# Patient Record
Sex: Male | Born: 1985 | Race: White | Hispanic: No | Marital: Single | Smoking: Never smoker
Health system: Southern US, Community
[De-identification: ages and names within clinical notes are randomized; demographics above are authoritative.]

## PROBLEM LIST (undated history)

## (undated) DIAGNOSIS — F909 Attention-deficit hyperactivity disorder, unspecified type: Secondary | ICD-10-CM

## (undated) DIAGNOSIS — K5939 Other megacolon: Secondary | ICD-10-CM

## (undated) DIAGNOSIS — J45909 Unspecified asthma, uncomplicated: Secondary | ICD-10-CM

## (undated) DIAGNOSIS — F32A Depression, unspecified: Secondary | ICD-10-CM

## (undated) HISTORY — PX: TONSILLECTOMY: SUR1361

---

## 2017-10-25 ENCOUNTER — Emergency Department (HOSPITAL_COMMUNITY)
Admission: EM | Admit: 2017-10-25 | Discharge: 2017-10-25 | Disposition: A | Discharge status: Home / Self Care | Payer: Medicaid - Out of State | Attending: Emergency Medicine | Admitting: Emergency Medicine

## 2017-10-25 ENCOUNTER — Encounter (HOSPITAL_COMMUNITY): Payer: Self-pay

## 2017-10-25 DIAGNOSIS — R112 Nausea with vomiting, unspecified: Secondary | ICD-10-CM | POA: Diagnosis present

## 2017-10-25 DIAGNOSIS — E876 Hypokalemia: Secondary | ICD-10-CM | POA: Diagnosis not present

## 2017-10-25 DIAGNOSIS — R197 Diarrhea, unspecified: Secondary | ICD-10-CM | POA: Insufficient documentation

## 2017-10-25 LAB — CBC
HEMATOCRIT: 44.4 % (ref 39.0–52.0)
Hemoglobin: 14.7 g/dL (ref 13.0–17.0)
MCH: 29.8 pg (ref 26.0–34.0)
MCHC: 33.1 g/dL (ref 30.0–36.0)
MCV: 90.1 fL (ref 78.0–100.0)
Platelets: 280 10*3/uL (ref 150–400)
RBC: 4.93 MIL/uL (ref 4.22–5.81)
RDW: 12.9 % (ref 11.5–15.5)
WBC: 5.7 10*3/uL (ref 4.0–10.5)

## 2017-10-25 LAB — COMPREHENSIVE METABOLIC PANEL
ALT: 34 U/L (ref 17–63)
AST: 23 U/L (ref 15–41)
Albumin: 4.1 g/dL (ref 3.5–5.0)
Alkaline Phosphatase: 49 U/L (ref 38–126)
Anion gap: 9 (ref 5–15)
BUN: 12 mg/dL (ref 6–20)
CHLORIDE: 103 mmol/L (ref 101–111)
CO2: 21 mmol/L — AB (ref 22–32)
CREATININE: 1.02 mg/dL (ref 0.61–1.24)
Calcium: 8.5 mg/dL — ABNORMAL LOW (ref 8.9–10.3)
GFR calc Af Amer: >60 mL/min (ref >60)
GFR calc non Af Amer: >60 mL/min (ref >60)
Glucose, Bld: 113 mg/dL — ABNORMAL HIGH (ref 65–99)
POTASSIUM: 3.4 mmol/L — AB (ref 3.5–5.1)
SODIUM: 133 mmol/L — AB (ref 135–145)
Total Bilirubin: 0.8 mg/dL (ref 0.3–1.2)
Total Protein: 6.9 g/dL (ref 6.5–8.1)

## 2017-10-25 LAB — LIPASE, BLOOD: LIPASE: 24 U/L (ref 11–51)

## 2017-10-25 MED ORDER — ONDANSETRON 4 MG PO TBDP
4.0000 mg | ORAL_TABLET | Freq: Once | ORAL | Status: AC
Start: 2017-10-25 — End: 2017-10-25
  Administered 2017-10-25: 4 mg via ORAL
  Filled 2017-10-25: qty 1

## 2017-10-25 MED ORDER — ONDANSETRON 4 MG PO TBDP: ORAL_TABLET | ORAL | 0 refills | Status: DC

## 2017-10-25 NOTE — ED Triage Notes (Signed)
Patient complains of abdominal cramping that started yesterday with vomiting and diarrhea today. Patient denies abdominal pain at triage and no active vomiting

## 2017-10-25 NOTE — ED Notes (Signed)
Pt stable and ambulatory for discharge, states understanding follow up.  

## 2017-10-25 NOTE — ED Provider Notes (Signed)
[facility]   CSN: [id number] Arrival date & time: 10/25/17  1238     History   Chief Complaint No chief complaint on file.   HPI David Valencia is a 32 y.o. male.  Patient with history of megacolon,no regular follow-up by gastroenterology, depression history presents with recurrent vomiting and diarrhea since yesterday. No sick contacts, no recent travel, no abdominal pain. Mild cramping mostly diarrhea currently.no new medications or new foods.     History reviewed. No pertinent past medical history.  There are no active problems to display for this patient.   History reviewed. No pertinent surgical history.      Home Medications    Prior to Admission medications   Medication Sig Start Date End Date Taking? Authorizing Provider  ondansetron (ZOFRAN ODT) 4 MG disintegrating tablet 4mg  ODT q4 hours prn nausea/vomit 10/25/17   Blane OharaZavitz, Shivansh Hardaway, MD    Family History No family history on file.  Social History Social History   Tobacco Use  . Smoking status: Not on file  Substance Use Topics  . Alcohol use: Not on file  . Drug use: Not on file     Allergies   Sulfa antibiotics   Review of Systems Review of Systems  Constitutional: Negative for chills and fever.  HENT: Negative for congestion.   Eyes: Negative for visual disturbance.  Respiratory: Negative for shortness of breath.   Cardiovascular: Negative for chest pain.  Gastrointestinal: Positive for diarrhea, nausea and vomiting. Negative for abdominal pain.  Genitourinary: Negative for dysuria and flank pain.  Musculoskeletal: Negative for back pain, neck pain and neck stiffness.  Skin: Negative for rash.  Neurological: Negative for light-headedness and headaches.     Physical Exam Updated Vital Signs BP 115/90 (BP Location: Right Arm)   Pulse 96   Temp 98.1 F (36.7 C) (Oral)   Resp 20   SpO2 96%   Physical Exam  Constitutional: He is  oriented to person, place, and time. He appears well-developed and well-nourished.  HENT:  Head: Normocephalic and atraumatic.  Eyes: Conjunctivae are normal. Right eye exhibits no discharge. Left eye exhibits no discharge.  Neck: Normal range of motion. Neck supple. No tracheal deviation present.  Cardiovascular: Normal rate and regular rhythm.  Pulmonary/Chest: Effort normal and breath sounds normal.  Abdominal: Soft. He exhibits no distension. There is no tenderness. There is no guarding.  Musculoskeletal: He exhibits no edema.  Neurological: He is alert and oriented to person, place, and time.  Skin: Skin is warm. No rash noted.  Psychiatric: He has a normal mood and affect.  Nursing note and vitals reviewed.    ED Treatments / Results  Labs (all labs ordered are listed, but only abnormal results are displayed) Labs Reviewed  COMPREHENSIVE METABOLIC PANEL - Abnormal; Notable for the following components:      Result Value   Sodium 133 (*)    Potassium 3.4 (*)    CO2 21 (*)    Glucose, Bld 113 (*)    Calcium 8.5 (*)    All other components within normal limits  LIPASE, BLOOD  CBC  URINALYSIS, ROUTINE W REFLEX MICROSCOPIC    EKG None  Radiology No results found.  Procedures Procedures (including critical care time)  Medications Ordered in ED Medications  ondansetron (ZOFRAN-ODT) disintegrating tablet 4 mg (has no administration in time range)     Initial Impression / Assessment and Plan / ED Course  I have reviewed the triage vital  signs and the nursing notes.  Pertinent labs & imaging results that were available during my care of the patient were reviewed by me and considered in my medical decision making (see chart for details).    Ell-appearing patient presents with recurrent vomiting and diarrhea. Blood work reassuring, no abdominal tenderness on exam no fevers. Discussed close follow-up with primary doctor in 2 days if no improvement. Nausea medicines  given. Oral fluids.  Results and differential diagnosis were discussed with the patient/parent/guardian. Xrays were independently reviewed by myself.  Close follow up outpatient was discussed, comfortable with the plan.   Medications  ondansetron (ZOFRAN-ODT) disintegrating tablet 4 mg (has no administration in time range)    Vitals:   10/25/17 1318  BP: 115/90  Pulse: 96  Resp: 20  Temp: 98.1 F (36.7 C)  TempSrc: Oral  SpO2: 96%    Final diagnoses:  Nausea vomiting and diarrhea  Hypokalemia     Final Clinical Impressions(s) / ED Diagnoses   Final diagnoses:  Nausea vomiting and diarrhea  Hypokalemia    ED Discharge Orders        Ordered    ondansetron (ZOFRAN ODT) 4 MG disintegrating tablet     10/25/17 1632       Blane Ohara, MD 10/25/17 [phone]

## 2017-10-25 NOTE — Discharge Instructions (Signed)
Gradually increase oral fluid and food intake. Take Zofran as needed for nausea. Your potassium is only minimally elevated and you do not need pillsfor this at this time. Follow-up with local doctor this week.

## 2018-10-21 ENCOUNTER — Encounter (HOSPITAL_COMMUNITY): Payer: Self-pay

## 2018-10-21 ENCOUNTER — Emergency Department (HOSPITAL_COMMUNITY): Payer: Self-pay

## 2018-10-21 ENCOUNTER — Other Ambulatory Visit: Payer: Self-pay

## 2018-10-21 ENCOUNTER — Emergency Department (HOSPITAL_COMMUNITY)
Admission: EM | Admit: 2018-10-21 | Discharge: 2018-10-21 | Disposition: A | Discharge status: Home / Self Care | Payer: Self-pay | Attending: Emergency Medicine | Admitting: Emergency Medicine

## 2018-10-21 DIAGNOSIS — R05 Cough: Secondary | ICD-10-CM | POA: Insufficient documentation

## 2018-10-21 DIAGNOSIS — R059 Cough, unspecified: Secondary | ICD-10-CM

## 2018-10-21 MED ORDER — BENZONATATE 200 MG PO CAPS: 200.0000 mg | ORAL_CAPSULE | Freq: Three times a day (TID) | ORAL | 0 refills | Status: DC | PRN

## 2018-10-21 MED ORDER — PREDNISONE 20 MG PO TABS: 40.0000 mg | ORAL_TABLET | Freq: Every day | ORAL | 0 refills | Status: DC

## 2018-10-21 MED ORDER — ALBUTEROL SULFATE HFA 108 (90 BASE) MCG/ACT IN AERS
2.0000 | INHALATION_SPRAY | Freq: Once | RESPIRATORY_TRACT | Status: AC
  Administered 2018-10-21: 2 via RESPIRATORY_TRACT
  Filled 2018-10-21: qty 6.7

## 2018-10-21 MED ORDER — PREDNISONE 20 MG PO TABS
40.0000 mg | ORAL_TABLET | Freq: Once | ORAL | Status: AC
  Administered 2018-10-21: 40 mg via ORAL
  Filled 2018-10-21: qty 2

## 2018-10-21 NOTE — Discharge Instructions (Signed)
Start the prednisone tomorrow.  2 puffs of your albuterol inhaler 4 times a day as needed.     Person Under Monitoring Name: David Valencia  Location: [address] [place] [address]   Infection Prevention Recommendations for Individuals Confirmed to have, or Being Evaluated for, 2019 Novel Coronavirus (COVID-19) Infection Who Receive Care at Home  Individuals who are confirmed to have, or are being evaluated for, COVID-19 should follow the prevention steps below until a healthcare provider or local or state health department says they can return to normal activities.  Stay home except to get medical care You should restrict activities outside your home, except for getting medical care. Do not go to work, school, or public areas, and do not use public transportation or taxis.  Call ahead before visiting your doctor Before your medical appointment, call the healthcare provider and tell them that you have, or are being evaluated for, COVID-19 infection. This will help the healthcare providers office take steps to keep other people from getting infected. Ask your healthcare provider to call the local or state health department.  Monitor your symptoms Seek prompt medical attention if your illness is worsening (e.g., difficulty breathing). Before going to your medical appointment, call the healthcare provider and tell them that you have, or are being evaluated for, COVID-19 infection. Ask your healthcare provider to call the local or state health department.  Wear a facemask You should wear a facemask that covers your nose and mouth when you are in the same room with other people and when you visit a healthcare provider. People who live with or visit you should also wear a facemask while they are in the same room with you.  Separate yourself from other people in your home As much as possible, you should stay in a different room from other people in your home. Also, you should  use a separate bathroom, if available.  Avoid sharing household items You should not share dishes, drinking glasses, cups, eating utensils, towels, bedding, or other items with other people in your home. After using these items, you should wash them thoroughly with soap and water.  Cover your coughs and sneezes Cover your mouth and nose with a tissue when you cough or sneeze, or you can cough or sneeze into your sleeve. Throw used tissues in a lined trash can, and immediately wash your hands with soap and water for at least 20 seconds or use an alcohol-based hand rub.  Wash your [employer] your hands often and thoroughly with soap and water for at least 20 seconds. You can use an alcohol-based hand sanitizer if soap and water are not available and if your hands are not visibly dirty. Avoid touching your eyes, nose, and mouth with unwashed hands.   Prevention Steps for Caregivers and Household Members of Individuals Confirmed to have, or Being Evaluated for, COVID-19 Infection Being Cared for in the Home  If you live with, or provide care at home for, a person confirmed to have, or being evaluated for, COVID-19 infection please follow these guidelines to prevent infection:  Follow healthcare providers instructions Make sure that you understand and can help the patient follow any healthcare provider instructions for all care.  Provide for the patients basic needs You should help the patient with basic needs in the home and provide support for getting groceries, prescriptions, and other personal needs.  Monitor the patients symptoms If they are getting sicker, call his or her medical provider and tell them  that the patient has, or is being evaluated for, COVID-19 infection. This will help the healthcare providers office take steps to keep other people from getting infected. Ask the healthcare provider to call the local or state health department.  Limit the number of people who  have contact with the patient If possible, have only one caregiver for the patient. Other household members should stay in another home or place of residence. If this is not possible, they should stay in another room, or be separated from the patient as much as possible. Use a separate bathroom, if available. Restrict visitors who do not have an essential need to be in the home.  Keep older adults, very young children, and other sick people away from the patient Keep older adults, very young children, and those who have compromised immune systems or chronic health conditions away from the patient. This includes people with chronic heart, lung, or kidney conditions, diabetes, and cancer.  Ensure good ventilation Make sure that shared spaces in the home have good air flow, such as from an air conditioner or an opened window, weather permitting.  Wash your hands often Wash your hands often and thoroughly with soap and water for at least 20 seconds. You can use an alcohol based hand sanitizer if soap and water are not available and if your hands are not visibly dirty. Avoid touching your eyes, nose, and mouth with unwashed hands. Use disposable paper towels to dry your hands. If not available, use dedicated cloth towels and replace them when they become wet.  Wear a facemask and gloves Wear a disposable facemask at all times in the room and gloves when you touch or have contact with the patients blood, body fluids, and/or secretions or excretions, such as sweat, saliva, sputum, nasal mucus, vomit, urine, or feces.  Ensure the mask fits over your nose and mouth tightly, and do not touch it during use. Throw out disposable facemasks and gloves after using them. Do not reuse. Wash your hands immediately after removing your facemask and gloves. If your personal clothing becomes contaminated, carefully remove clothing and launder. Wash your hands after handling contaminated clothing. Place all used  disposable facemasks, gloves, and other waste in a lined container before disposing them with other household waste. Remove gloves and wash your hands immediately after handling these items.  Do not share dishes, glasses, or other household items with the patient Avoid sharing household items. You should not share dishes, drinking glasses, cups, eating utensils, towels, bedding, or other items with a patient who is confirmed to have, or being evaluated for, COVID-19 infection. After the person uses these items, you should wash them thoroughly with soap and water.  Wash laundry thoroughly Immediately remove and wash clothes or bedding that have blood, body fluids, and/or secretions or excretions, such as sweat, saliva, sputum, nasal mucus, vomit, urine, or feces, on them. Wear gloves when handling laundry from the patient. Read and follow directions on labels of laundry or clothing items and detergent. In general, wash and dry with the warmest temperatures recommended on the label.  Clean all areas the individual has used often Clean all touchable surfaces, such as counters, tabletops, doorknobs, bathroom fixtures, toilets, phones, keyboards, tablets, and bedside tables, every day. Also, clean any surfaces that may have blood, body fluids, and/or secretions or excretions on them. Wear gloves when cleaning surfaces the patient has come in contact with. Use a diluted bleach solution (e.g., dilute bleach with 1 part bleach and 10  parts water) or a household disinfectant with a label that says EPA-registered for coronaviruses. To make a bleach solution at home, add 1 tablespoon of bleach to 1 quart (4 cups) of water. For a larger supply, add  cup of bleach to 1 gallon (16 cups) of water. Read labels of cleaning products and follow recommendations provided on product labels. Labels contain instructions for safe and effective use of the cleaning product including precautions you should take when applying  the product, such as wearing gloves or eye protection and making sure you have good ventilation during use of the product. Remove gloves and wash hands immediately after cleaning.  Monitor yourself for signs and symptoms of illness Caregivers and household members are considered close contacts, should monitor their health, and will be asked to limit movement outside of the home to the extent possible. Follow the monitoring steps for close contacts listed on the symptom monitoring form.   ? If you have additional questions, contact your local health department or call the epidemiologist on call at [phone] (available 24/7). ? This guidance is subject to change. For the most up-to-date guidance from [facility], please refer to their website: [employer]

## 2018-10-21 NOTE — ED Provider Notes (Signed)
[facility] EMERGENCY DEPARTMENT Provider Note   CSN: [id number] Arrival date & time: 10/21/18  1820    History   Chief Complaint Chief Complaint  Patient presents with  . Cough    HPI [place] Gwinner is a 33 y.o. male.     HPI   [place] Antrobus is a 33 y.o. male who presents to the Emergency Department complaining of cough and chest congestion.  Reports symptoms have been present for one month.  Cough worse for several days and occasionally productive.  He states that he has frequent episodes of bronchitis and usually improves with prednisone and albuterol.  He denies fever, body aches, chest pain, shortness of breath.  He is employed at [employer] and concerned if he has COVID19.   History reviewed. No pertinent past medical history.  There are no active problems to display for this patient.   History reviewed. No pertinent surgical history.    Home Medications    Prior to Admission medications   Medication Sig Start Date End Date Taking? Authorizing Provider  albuterol (PROVENTIL HFA;VENTOLIN HFA) 108 (90 Base) MCG/ACT inhaler Inhale 1-2 puffs into the lungs every 6 (six) hours as needed for wheezing or shortness of breath.   Yes [provider]    Family History No family history on file.  Social History Social History   Tobacco Use  . Smoking status: Never Smoker  . Smokeless tobacco: Never Used  Substance Use Topics  . Alcohol use: Never    Frequency: Never  . Drug use: Never     Allergies   Sulfa antibiotics   Review of Systems Review of Systems  Constitutional: Negative for appetite change, chills and fever.  HENT: Negative for congestion, sore throat and trouble swallowing.   Respiratory: Positive for cough. Negative for chest tightness, shortness of breath and wheezing.   Cardiovascular: Negative for chest pain.  Gastrointestinal: Negative for abdominal pain, nausea and vomiting.  Genitourinary: Negative for dysuria.   Musculoskeletal: Negative for arthralgias and myalgias.  Skin: Negative for rash.  Neurological: Negative for dizziness, weakness and numbness.  Hematological: Negative for adenopathy.     Physical Exam Updated Vital Signs BP 134/81 (BP Location: Right Arm)   Pulse 87   Temp 98 F (36.7 C) (Oral)   Resp 18   Ht 5\' 11"  (1.803 m)   Wt 106.6 kg   SpO2 97%   BMI 32.78 kg/m   Physical Exam Vitals signs and nursing note reviewed.  Constitutional:      General: He is not in acute distress.    Appearance: Normal appearance. He is well-developed.  HENT:     Head: Normocephalic and atraumatic.     Right Ear: Tympanic membrane and ear canal normal.     Left Ear: Tympanic membrane and ear canal normal.     Mouth/Throat:     Mouth: Mucous membranes are moist.     Pharynx: Oropharynx is clear. Uvula midline. No oropharyngeal exudate or posterior oropharyngeal erythema.  Neck:     Musculoskeletal: Full passive range of motion without pain, normal range of motion and neck supple.     Trachea: Phonation normal.  Cardiovascular:     Rate and Rhythm: Normal rate and regular rhythm.     Pulses: Normal pulses.     Heart sounds: No murmur.  Pulmonary:     Effort: Pulmonary effort is normal. No respiratory distress.     Breath sounds: Normal breath sounds. No stridor. No wheezing or rales.  Chest:     Chest wall: No tenderness.  Abdominal:     Palpations: Abdomen is soft.     Tenderness: There is no abdominal tenderness.  Musculoskeletal: Normal range of motion.  Lymphadenopathy:     Cervical: No cervical adenopathy.  Skin:    General: Skin is warm.     Capillary Refill: Capillary refill takes less than 2 seconds.     Findings: No rash.  Neurological:     Mental Status: He is alert. Mental status is at baseline.     Motor: No abnormal muscle tone.      ED Treatments / Results  Labs (all labs ordered are listed, but only abnormal results are displayed) Labs Reviewed - No data  to display  EKG None  Radiology Dg Chest Portable 1 View  Result Date: 10/21/2018 CLINICAL DATA:  Cough and congestion. EXAM: PORTABLE CHEST 1 VIEW COMPARISON:  None. FINDINGS: The cardiomediastinal contours are normal. Mild bronchial thickening. Pulmonary vasculature is normal. No consolidation, pleural effusion, or pneumothorax. No acute osseous abnormalities are seen. IMPRESSION: Mild bronchial thickening without pneumonia. Electronically Signed   By: Narda Rutherford M.D.   On: 10/21/2018 21:44    Procedures Procedures (including critical care time)  Medications Ordered in ED Medications  albuterol (PROVENTIL HFA;VENTOLIN HFA) 108 (90 Base) MCG/ACT inhaler 2 puff (2 puffs Inhalation Given 10/21/18 2234)  predniSONE (DELTASONE) tablet 40 mg (40 mg Oral Given 10/21/18 2233)     Initial Impression / Assessment and Plan / ED Course  I have reviewed the triage vital signs and the nursing notes.  Pertinent labs & imaging results that were available during my care of the patient were reviewed by me and considered in my medical decision making (see chart for details).        Patient with cough and chest congestion for 1 month, worse for 2 to 3 days.  He is afebrile here, no shortness of breath. No respiratory distress or hypoxia.  He is well appearing.   He is employed as a TEFL teacher.  Patient understands he will not be tested for COVID-19. He agrees to self quarantine instructions, information provided. Strict return precautions discussed and he verbalized understanding and agrees to plan.    Final Clinical Impressions(s) / ED Diagnoses   Final diagnoses:  Cough    ED Discharge Orders    None       Pauline Aus, PA-C 10/22/18 1747    Vanetta Mulders, MD 11/03/18 [phone]

## 2018-10-21 NOTE — ED Triage Notes (Signed)
Pt presents to ED with complaints of productive cough, chest congestion x 1 month. Pt denies fever or travel.

## 2020-04-04 ENCOUNTER — Ambulatory Visit
Admission: EM | Admit: 2020-04-04 | Discharge: 2020-04-04 | Disposition: A | Discharge status: Home / Self Care | Payer: Self-pay | Attending: Emergency Medicine | Admitting: Emergency Medicine

## 2020-04-04 ENCOUNTER — Emergency Department (HOSPITAL_COMMUNITY)
Admission: EM | Admit: 2020-04-04 | Discharge: 2020-04-04 | Disposition: A | Discharge status: Home / Self Care | Payer: Self-pay | Attending: Emergency Medicine | Admitting: Emergency Medicine

## 2020-04-04 ENCOUNTER — Encounter (HOSPITAL_COMMUNITY): Payer: Self-pay | Admitting: *Deleted

## 2020-04-04 ENCOUNTER — Encounter: Payer: Self-pay | Admitting: Emergency Medicine

## 2020-04-04 ENCOUNTER — Other Ambulatory Visit: Payer: Self-pay

## 2020-04-04 DIAGNOSIS — R110 Nausea: Secondary | ICD-10-CM | POA: Insufficient documentation

## 2020-04-04 DIAGNOSIS — R197 Diarrhea, unspecified: Secondary | ICD-10-CM | POA: Insufficient documentation

## 2020-04-04 DIAGNOSIS — K644 Residual hemorrhoidal skin tags: Secondary | ICD-10-CM

## 2020-04-04 DIAGNOSIS — R109 Unspecified abdominal pain: Secondary | ICD-10-CM | POA: Insufficient documentation

## 2020-04-04 DIAGNOSIS — K6289 Other specified diseases of anus and rectum: Secondary | ICD-10-CM | POA: Insufficient documentation

## 2020-04-04 DIAGNOSIS — Z5321 Procedure and treatment not carried out due to patient leaving prior to being seen by health care provider: Secondary | ICD-10-CM | POA: Insufficient documentation

## 2020-04-04 HISTORY — DX: Depression, unspecified: F32.A

## 2020-04-04 HISTORY — DX: Attention-deficit hyperactivity disorder, unspecified type: F90.9

## 2020-04-04 HISTORY — DX: Other megacolon: K59.39

## 2020-04-04 LAB — POC HEMOCCULT BLD/STL (OFFICE/1-CARD/DIAGNOSTIC)
Card #1 Date: NEGATIVE
Fecal Occult Blood, POC: NEGATIVE

## 2020-04-04 MED ORDER — ONDANSETRON 4 MG PO TBDP: 4.0000 mg | ORAL_TABLET | Freq: Three times a day (TID) | ORAL | 0 refills | Status: DC | PRN

## 2020-04-04 MED ORDER — HYDROCORTISONE (PERIANAL) 2.5 % EX CREA: 1.0000 "application " | TOPICAL_CREAM | Freq: Two times a day (BID) | CUTANEOUS | 0 refills | Status: DC

## 2020-04-04 NOTE — ED Triage Notes (Signed)
Pt with loose stools since August 1. Pt with abd pain and rectal pain for past 4 days.  + nausea, no emesis.

## 2020-04-04 NOTE — ED Provider Notes (Signed)
[facility]    CSN: [id number] Arrival date & time: 04/04/20  1807      History   Chief Complaint Chief Complaint  Patient presents with  . Abdominal Pain    HPI David Valencia is a 34 y.o. male presenting today for evaluation, rectal pain.  Patient reports that over the past month he has had some looser stools.  Of recently in the past 4 days has developed increased rectal pain swelling and discomfort.  He is also had some lower abdominal pain which he describes as being sharp.  This is located in bilateral lower abdominal areas.  Pain will wax and wane.  He has also had some nausea and vomiting beginning on Friday, vomiting has improved, but continues to be nauseous.  Reports intermittent sore throat as well.  Tried Pepto-Bismol without relief.  Does report one episode of loose or bloody stools 1 to 2 weeks ago, but has not had recurrence.  His main concern today is the sensation of a knot at his rectum which is making it harder to have a bowel movement.  He denies any recent travel.  Denies exposure to fresh water's.  Denies recent antibiotic use.  He has had prior colonoscopy which was unremarkable.  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  Denies any pus or discharge from rectum.     HPI  Past Medical History:  Diagnosis Date  . ADHD   . Depression   . Megacolon     There are no problems to display for this patient.   Past Surgical History:  Procedure Laterality Date  . TONSILLECTOMY         Home Medications    Prior to Admission medications   Medication Sig Start Date End Date Taking? Authorizing Provider  albuterol (PROVENTIL HFA;VENTOLIN HFA) 108 (90 Base) MCG/ACT inhaler Inhale 1-2 puffs into the lungs every 6 (six) hours as needed for wheezing or shortness of breath.    [provider]  benzonatate (TESSALON) 200 MG capsule Take 1 capsule (200 mg total) by mouth 3 (three) times daily as needed for cough. 10/21/18   Triplett, Tammy, PA-C    hydrocortisone (ANUSOL-HC) 2.5 % rectal cream Place 1 application rectally 2 (two) times daily. 04/04/20   Angele Wiemann C, PA-C  ondansetron (ZOFRAN ODT) 4 MG disintegrating tablet Take 1 tablet (4 mg total) by mouth every 8 (eight) hours as needed for nausea or vomiting. 04/04/20   Rohit Deloria, Junius Creamer, PA-C    Family History No family history on file.  Social History Social History   Tobacco Use  . Smoking status: Never Smoker  . Smokeless tobacco: Never Used  Vaping Use  . Vaping Use: Never used  Substance Use Topics  . Alcohol use: Never  . Drug use: Never     Allergies   Sulfa antibiotics   Review of Systems Review of Systems  Constitutional: Negative for activity change, appetite change, chills, fatigue and fever.  HENT: Positive for sore throat. Negative for congestion, ear pain, rhinorrhea, sinus pressure and trouble swallowing.   Eyes: Negative for discharge and redness.  Respiratory: Negative for cough, chest tightness and shortness of breath.   Cardiovascular: Negative for chest pain.  Gastrointestinal: Positive for abdominal pain, diarrhea, nausea and rectal pain. Negative for vomiting.  Musculoskeletal: Negative for myalgias.  Skin: Negative for rash.  Neurological: Negative for dizziness, light-headedness and headaches.     Physical Exam Triage Vital Signs ED Triage Vitals [04/04/20 1925]  Enc Vitals Group  BP      Pulse      Resp      Temp      Temp src      SpO2      Weight 218 lb (98.9 kg)     Height 6' (1.829 m)     Head Circumference      Peak Flow      Pain Score 4     Pain Loc      Pain Edu?      Excl. in GC?    No data found.  Updated Vital Signs BP 118/84 (BP Location: Right Arm)   Pulse 98   Temp 99.1 F (37.3 C) (Oral)   Resp 20   Ht 6' (1.829 m)   Wt 218 lb (98.9 kg)   SpO2 95%   BMI 29.57 kg/m   Visual Acuity Right Eye Distance:   Left Eye Distance:   Bilateral Distance:    Right Eye Near:   Left Eye Near:     Bilateral Near:     Physical Exam Vitals and nursing note reviewed.  Constitutional:      Appearance: He is well-developed.     Comments: No acute distress  HENT:     Head: Normocephalic and atraumatic.     Nose: Nose normal.  Eyes:     Conjunctiva/sclera: Conjunctivae normal.  Cardiovascular:     Rate and Rhythm: Normal rate.  Pulmonary:     Effort: Pulmonary effort is normal. No respiratory distress.  Abdominal:     General: There is no distension.     Comments: Soft, nondistended, nontender to palpation to bilateral upper quadrants and epigastrium, tenderness generalized throughout bilateral lower quadrants, no focal tenderness, negative rebound, negative Rovsing, negative McBurney's  Genitourinary:    Comments: Rectum with 2 cm thrombosed hemorrhoid to approximately 3 o'clock position, tender to palpation, no other masses palpated Musculoskeletal:        General: Normal range of motion.     Cervical back: Neck supple.  Skin:    General: Skin is warm and dry.  Neurological:     Mental Status: He is alert and oriented to person, place, and time.      UC Treatments / Results  Labs (all labs ordered are listed, but only abnormal results are displayed) Labs Reviewed  POC HEMOCCULT BLD/STL (OFFICE/1-CARD/DIAGNOSTIC)    EKG   Radiology No results found.  Procedures Procedures (including critical care time)  Medications Ordered in UC Medications - No data to display  Initial Impression / Assessment and Plan / UC Course  I have reviewed the triage vital signs and the nursing notes.  Pertinent labs & imaging results that were available during my care of the patient were reviewed by me and considered in my medical decision making (see chart for details).     1.  External hemorrhoid-discussed using Anusol cream, sitz bath, stool softeners, at home remedies.  Provided contact for surgery to follow-up not drinking with provided measures.  2.  Looser  stools-discussed possible stool sample, patient unable to obtain today, advised if symptoms persisting after resolution of hemorrhoids may be worthwhile to follow-up with gastroenterology given symptoms x1 month.  Discussed strict return precautions. Patient verbalized understanding and is agreeable with plan.  Final Clinical Impressions(s) / UC Diagnoses   Final diagnoses:  External hemorrhoid     Discharge Instructions     Anusol-hc cream twice daily Sitz baths Stool softners over the counter to help with passing  stools  May try sitting on warm tea bag Donut pillow Follow up with surgery if not resolving spontaneously    ED Prescriptions    Medication Sig Dispense Auth. Provider   hydrocortisone (ANUSOL-HC) 2.5 % rectal cream Place 1 application rectally 2 (two) times daily. 30 g Hilaria Titsworth C, PA-C   ondansetron (ZOFRAN ODT) 4 MG disintegrating tablet Take 1 tablet (4 mg total) by mouth every 8 (eight) hours as needed for nausea or vomiting. 20 tablet Avyn Aden, [place] C, PA-C     PDMP not reviewed this encounter.   Lew Dawes, [place] 04/05/20 [phone]

## 2020-04-04 NOTE — ED Triage Notes (Signed)
Loose stools x over a month. Recently pt is experiencing nausea/ vomiting and rectal pain from a knot that is around his rectum.

## 2020-04-04 NOTE — Discharge Instructions (Signed)
Anusol-hc cream twice daily Sitz baths Stool softners over the counter to help with passing stools  May try sitting on warm tea bag Donut pillow Follow up with surgery if not resolving spontaneously

## 2020-04-11 ENCOUNTER — Other Ambulatory Visit: Payer: Self-pay

## 2020-04-11 ENCOUNTER — Ambulatory Visit
Admission: EM | Admit: 2020-04-11 | Discharge: 2020-04-11 | Disposition: A | Discharge status: Home / Self Care | Payer: Self-pay

## 2020-04-11 DIAGNOSIS — B084 Enteroviral vesicular stomatitis with exanthem: Secondary | ICD-10-CM

## 2020-04-11 NOTE — Discharge Instructions (Addendum)
Wash your hands daily if very mild soap Hand-foot-and-mouth disease is a viral infection that will self resolve within 2 weeks. If you develop worsening symptoms please go to the ER

## 2020-04-11 NOTE — ED Triage Notes (Signed)
Pt is also having some testicular pain

## 2020-04-11 NOTE — ED Provider Notes (Signed)
[facility]   [id number] 04/11/20 Arrival Time: 1632   Chief Complaint  Patient presents with  . Rash     SUBJECTIVE: History from: patient.   David Valencia is a 34 y.o. male who presented to the urgent care for complaint of rash for the past 1 week.  She denies changes in soaps, detergents, or anyone with similar symptoms.  He localized rash to hands, and mouth.  Described as red and spreading.  Esther OTC medication without relief.  Denies aggravating factors.  Denies similar symptoms in the past.  Denies chills, fever, nausea, vomiting, diarrhea  ROS: As per HPI.  All other pertinent ROS negative.     Past Medical History:  Diagnosis Date  . ADHD   . Depression   . Megacolon    Past Surgical History:  Procedure Laterality Date  . TONSILLECTOMY     Allergies  Allergen Reactions  . Sulfa Antibiotics Rash   No current facility-administered medications on file prior to encounter.   Current Outpatient Medications on File Prior to Encounter  Medication Sig Dispense Refill  . albuterol (PROVENTIL HFA;VENTOLIN HFA) 108 (90 Base) MCG/ACT inhaler Inhale 1-2 puffs into the lungs every 6 (six) hours as needed for wheezing or shortness of breath.    . benzonatate (TESSALON) 200 MG capsule Take 1 capsule (200 mg total) by mouth 3 (three) times daily as needed for cough. 21 capsule 0  . hydrocortisone (ANUSOL-HC) 2.5 % rectal cream Place 1 application rectally 2 (two) times daily. 30 g 0  . ondansetron (ZOFRAN ODT) 4 MG disintegrating tablet Take 1 tablet (4 mg total) by mouth every 8 (eight) hours as needed for nausea or vomiting. 20 tablet 0   Social History   Socioeconomic History  . Marital status: Single    Spouse name: Not on file  . Number of children: Not on file  . Years of education: Not on file  . Highest education level: Not on file  Occupational History  . Not on file  Tobacco Use  . Smoking status: Never Smoker  . Smokeless tobacco: Never Used    Vaping Use  . Vaping Use: Never used  Substance and Sexual Activity  . Alcohol use: Never  . Drug use: Never  . Sexual activity: Not on file  Other Topics Concern  . Not on file  Social History Narrative  . Not on file   Social Determinants of Health   Financial Resource Strain:   . Difficulty of Paying Living Expenses: Not on file  Food Insecurity:   . Worried About Programme researcher, broadcasting/film/video in the Last Year: Not on file  . Ran Out of Food in the Last Year: Not on file  Transportation Needs:   . Lack of Transportation (Medical): Not on file  . Lack of Transportation (Non-Medical): Not on file  Physical Activity:   . Days of Exercise per Week: Not on file  . Minutes of Exercise per Session: Not on file  Stress:   . Feeling of Stress : Not on file  Social Connections:   . Frequency of Communication with Friends and Family: Not on file  . Frequency of Social Gatherings with Friends and Family: Not on file  . Attends Religious Services: Not on file  . Active Member of Clubs or Organizations: Not on file  . Attends Banker Meetings: Not on file  . Marital Status: Not on file  Intimate Partner Violence:   . Fear of Current or  Ex-Partner: Not on file  . Emotionally Abused: Not on file  . Physically Abused: Not on file  . Sexually Abused: Not on file   Family History  Family history unknown: Yes    OBJECTIVE:  Vitals:   04/11/20 1722  BP: (!) 132/94  Pulse: 88  Resp: 20  Temp: 98.9 F (37.2 C)  SpO2: 95%     Physical Exam Vitals and nursing note reviewed.  Constitutional:      General: He is not in acute distress.    Appearance: Normal appearance. He is normal weight. He is not ill-appearing, toxic-appearing or diaphoretic.  Cardiovascular:     Rate and Rhythm: Normal rate and regular rhythm.     Pulses: Normal pulses.     Heart sounds: Normal heart sounds. No murmur heard.  No friction rub. No gallop.   Pulmonary:     Effort: Pulmonary effort is  normal. No respiratory distress.     Breath sounds: Normal breath sounds. No stridor. No wheezing, rhonchi or rales.  Chest:     Chest wall: No tenderness.  Skin:    Findings: Rash present. Rash is vesicular.     Comments: Vesicular rash around mouth and hand  Neurological:     Mental Status: He is alert and oriented to person, place, and time.      LABS:  No results found for this or any previous visit (from the past 24 hour(s)).   ASSESSMENT & PLAN:  1. Hand, foot and mouth disease     No orders of the defined types were placed in this encounter.   Discharge Instructions  Wash your hands daily if very mild soap Hand-foot-and-mouth disease is a viral infection that will self resolve within 2 weeks. If you develop worsening symptoms please go to the ER  Reviewed expectations re: course of current medical issues. Questions answered. Outlined signs and symptoms indicating need for more acute intervention. Patient verbalized understanding. After Visit Summary given.      Note: This document was prepared using Dragon voice recognition software and may include unintentional dictation errors.    Durward Parcel, FNP 04/11/20 1857

## 2020-04-11 NOTE — ED Triage Notes (Signed)
Pt presents with rash on hands and mouth that began a week ago

## 2020-06-28 IMAGING — CR PORTABLE CHEST - 1 VIEW
1 series · 1 of 1 positions shown · non-contrast
Comparison: None.

CLINICAL DATA: Cough and congestion.

EXAM:
PORTABLE CHEST 1 VIEW

[portable]
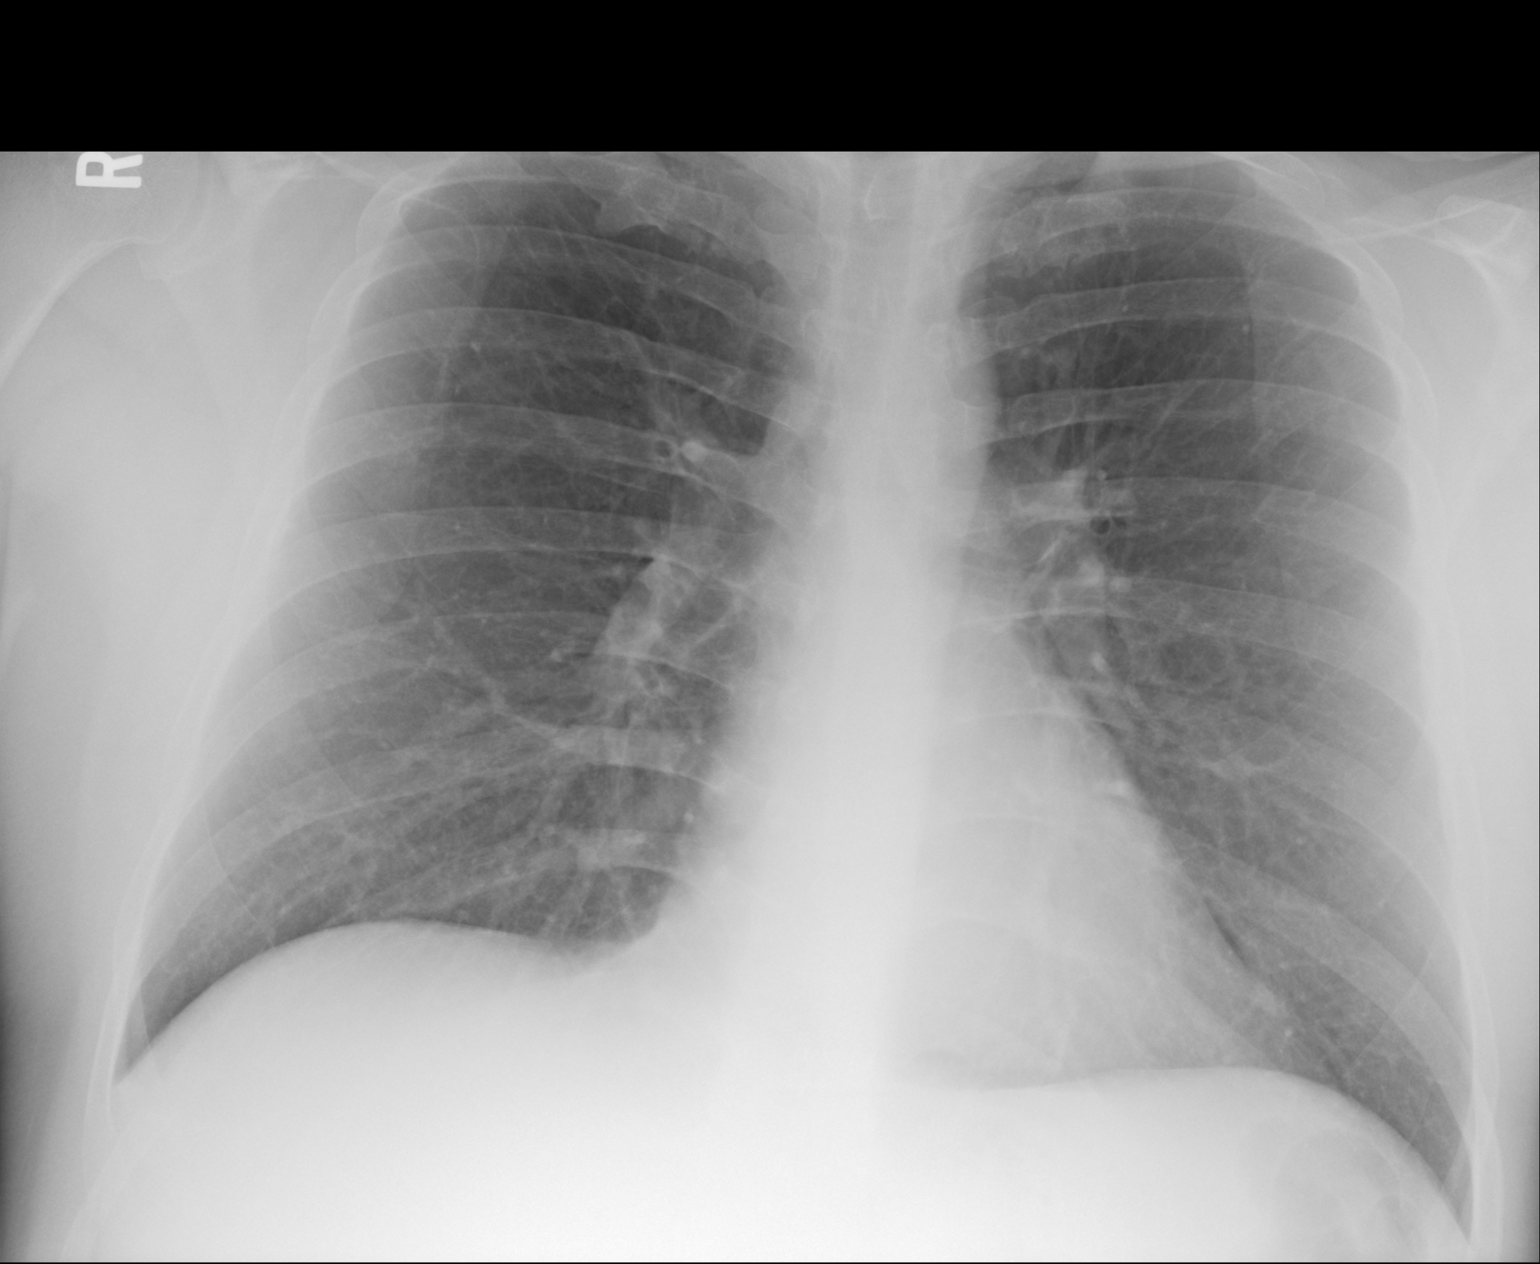

[1 of 1 positions shown; findings below may reference images not displayed]

FINDINGS: The cardiomediastinal contours are normal. Mild bronchial
thickening. Pulmonary vasculature is normal. No consolidation,
pleural effusion, or pneumothorax. No acute osseous abnormalities
are seen.
IMPRESSION: Mild bronchial thickening without pneumonia.

## 2020-07-16 ENCOUNTER — Ambulatory Visit
Admission: RE | Admit: 2020-07-16 | Discharge: 2020-07-16 | Disposition: A | Discharge status: Home / Self Care | Payer: Self-pay | Source: Ambulatory Visit | Attending: Urgent Care | Admitting: Urgent Care

## 2020-07-16 ENCOUNTER — Other Ambulatory Visit: Payer: Self-pay

## 2020-07-16 VITALS — BP 134/86 | HR 86 | Temp 98.1°F | Resp 18

## 2020-07-16 DIAGNOSIS — R197 Diarrhea, unspecified: Secondary | ICD-10-CM

## 2020-07-16 DIAGNOSIS — K529 Noninfective gastroenteritis and colitis, unspecified: Secondary | ICD-10-CM

## 2020-07-16 MED ORDER — LOPERAMIDE HCL 2 MG PO CAPS: 2.0000 mg | ORAL_CAPSULE | Freq: Two times a day (BID) | ORAL | 0 refills | Status: AC | PRN

## 2020-07-16 MED ORDER — ONDANSETRON 8 MG PO TBDP: 8.0000 mg | ORAL_TABLET | Freq: Three times a day (TID) | ORAL | 0 refills | Status: AC | PRN

## 2020-07-16 NOTE — ED Provider Notes (Addendum)
[facility]   MRN: [id number] DOB: 1986-05-01  Subjective:   David Valencia is a 34 y.o. male presenting for 5-day history of acute onset nausea with vomiting, diarrhea.  Vomiting has improved the past 2 days.  He still has some nausea and diarrhea.  He is having about 5-10 stools a day.  Has been able to drink fluids, eat broths and soft foods.  No recent hospitalizations, antibiotic use, long distance travel.  No fever, cough, chest pain, shortness of breath, loss of sense of taste and smell, bloody stools, hematemesis.  Has not taken any medications for relief.  No current facility-administered medications for this encounter.  Current Outpatient Medications:  .  albuterol (PROVENTIL HFA;VENTOLIN HFA) 108 (90 Base) MCG/ACT inhaler, Inhale 1-2 puffs into the lungs every 6 (six) hours as needed for wheezing or shortness of breath., Disp: , Rfl:    Allergies  Allergen Reactions  . Sulfa Antibiotics Rash    Past Medical History:  Diagnosis Date  . ADHD   . Depression   . Megacolon      Past Surgical History:  Procedure Laterality Date  . TONSILLECTOMY      Family History  Problem Relation Age of Onset  . Cancer Mother     Social History   Tobacco Use  . Smoking status: Never Smoker  . Smokeless tobacco: Never Used  Vaping Use  . Vaping Use: Never used  Substance Use Topics  . Alcohol use: Never  . Drug use: Never    ROS   Objective:   Vitals: BP 134/86   Pulse 86   Temp 98.1 F (36.7 C)   Resp 18   SpO2 95%   Physical Exam Constitutional:      General: He is not in acute distress.    Appearance: Normal appearance. He is well-developed, normal weight and well-nourished. He is not ill-appearing, toxic-appearing or diaphoretic.  HENT:     Head: Normocephalic and atraumatic.     Right Ear: External ear normal.     Left Ear: External ear normal.     Nose: Nose normal.     Mouth/Throat:     Mouth: Oropharynx is clear and moist.      Pharynx: Oropharynx is clear.  Eyes:     General: No scleral icterus.       Right eye: No discharge.        Left eye: No discharge.     Extraocular Movements: Extraocular movements intact.     Pupils: Pupils are equal, round, and reactive to light.  Cardiovascular:     Rate and Rhythm: Normal rate and regular rhythm.     Pulses: Intact distal pulses.     Heart sounds: No murmur heard. No friction rub. No gallop.   Pulmonary:     Effort: Pulmonary effort is normal. No respiratory distress.     Breath sounds: No wheezing or rales.  Abdominal:     General: Bowel sounds are normal. There is no distension.     Palpations: Abdomen is soft. There is no mass.     Tenderness: There is abdominal tenderness (Generalized, lower). There is no guarding or rebound.  Musculoskeletal:     Cervical back: Normal range of motion.  Skin:    General: Skin is warm and dry.  Neurological:     Mental Status: He is alert and oriented to person, place, and time.  Psychiatric:        Mood and Affect: Mood and affect and  mood normal.        Behavior: Behavior normal.        Thought Content: Thought content normal.        Judgment: Judgment normal.     Assessment and Plan :   PDMP not reviewed this encounter.  1. Gastroenteritis   2. Nausea vomiting and diarrhea     Vital signs and physical exam findings stable for outpatient management.  Will manage for suspected viral gastroenteritis with supportive care.  Recommended patient hydrate well, eat light meals and maintain electrolytes.  Will use Zofran and Imodium for nausea, vomiting and diarrhea. Counseled patient on potential for adverse effects with medications prescribed/recommended today, ER and return-to-clinic precautions discussed, patient verbalized understanding.    Wallis Bamberg, [place] 07/16/20 [phone]

## 2020-07-16 NOTE — ED Triage Notes (Signed)
Pt presents with nausea and diarrhea that began on wednesday last week,  Pt states he had vomiting that resolved on Saturday, but nausea and diarrhea have been persistent

## 2020-07-16 NOTE — Discharge Instructions (Addendum)

## 2023-07-26 ENCOUNTER — Ambulatory Visit (INDEPENDENT_AMBULATORY_CARE_PROVIDER_SITE_OTHER): Payer: BLUE CROSS/BLUE SHIELD

## 2023-07-26 ENCOUNTER — Ambulatory Visit
Admission: EM | Admit: 2023-07-26 | Discharge: 2023-07-26 | Disposition: A | Discharge status: Home / Self Care | Payer: BC Managed Care – PPO | Attending: Emergency Medicine | Admitting: Emergency Medicine

## 2023-07-26 ENCOUNTER — Encounter: Payer: Self-pay | Admitting: Emergency Medicine

## 2023-07-26 DIAGNOSIS — R058 Other specified cough: Secondary | ICD-10-CM

## 2023-07-26 DIAGNOSIS — J069 Acute upper respiratory infection, unspecified: Secondary | ICD-10-CM | POA: Diagnosis not present

## 2023-07-26 DIAGNOSIS — J45901 Unspecified asthma with (acute) exacerbation: Secondary | ICD-10-CM | POA: Diagnosis not present

## 2023-07-26 HISTORY — DX: Unspecified asthma, uncomplicated: J45.909

## 2023-07-26 LAB — POC COVID19/FLU A&B COMBO
Covid Antigen, POC: NEGATIVE
Influenza A Antigen, POC: NEGATIVE
Influenza B Antigen, POC: NEGATIVE

## 2023-07-26 MED ORDER — PREDNISONE 10 MG PO TABS: 40.0000 mg | ORAL_TABLET | Freq: Every day | ORAL | 0 refills | Status: AC

## 2023-07-26 MED ORDER — AZITHROMYCIN 250 MG PO TABS: 250.0000 mg | ORAL_TABLET | Freq: Every day | ORAL | 0 refills | Status: AC

## 2023-07-26 MED ORDER — ALBUTEROL SULFATE HFA 108 (90 BASE) MCG/ACT IN AERS
1.0000 | INHALATION_SPRAY | Freq: Four times a day (QID) | RESPIRATORY_TRACT | 0 refills | Status: AC | PRN
Start: 2023-07-26 — End: ?

## 2023-07-26 MED ORDER — ALBUTEROL SULFATE (2.5 MG/3ML) 0.083% IN NEBU
2.5000 mg | INHALATION_SOLUTION | Freq: Once | RESPIRATORY_TRACT | Status: AC
  Administered 2023-07-26: 2.5 mg via RESPIRATORY_TRACT

## 2023-07-26 NOTE — Discharge Instructions (Signed)
Use the albuterol inhaler as directed.  Take the Zithromax and prednisone as directed.    Follow-up with your new primary care provider as scheduled.  Go to the emergency department if you have shortness of breath or other concerning symptoms.

## 2023-07-26 NOTE — ED Provider Notes (Signed)
[facility]    CSN: [id number] Arrival date & time: 07/26/23  1152      History   Chief Complaint No chief complaint on file.   HPI David Valencia is a 37 y.o. male.  Patient presents with 1 week history of congestion and cough.  His cough is getting worse and has become productive of yellow sputum.  He has shortness of breath with coughing episodes.  No fever or chest pain.  He reports history of asthma but does not currently have an albuterol inhaler.  The history is provided by the patient and medical records.    Past Medical History:  Diagnosis Date   ADHD    Asthma    Depression    Megacolon     There are no active problems to display for this patient.   Past Surgical History:  Procedure Laterality Date   TONSILLECTOMY         Home Medications    Prior to Admission medications   Medication Sig Start Date End Date Taking? Authorizing Provider  albuterol (VENTOLIN HFA) 108 (90 Base) MCG/ACT inhaler Inhale 1-2 puffs into the lungs every 6 (six) hours as needed. 07/26/23  Yes Mickie Bail, NP  azithromycin (ZITHROMAX) 250 MG tablet Take 1 tablet (250 mg total) by mouth daily. Take first 2 tablets together, then 1 every day until finished. 07/26/23  Yes Mickie Bail, NP  predniSONE (DELTASONE) 10 MG tablet Take 4 tablets (40 mg total) by mouth daily for 5 days. 07/26/23 07/31/23 Yes Mickie Bail, NP  loperamide (IMODIUM) 2 MG capsule Take 1 capsule (2 mg total) by mouth 2 (two) times daily as needed for diarrhea or loose stools. 07/16/20   Wallis Bamberg, PA-C  ondansetron (ZOFRAN-ODT) 8 MG disintegrating tablet Take 1 tablet (8 mg total) by mouth every 8 (eight) hours as needed for nausea or vomiting. 07/16/20   Wallis Bamberg, PA-C    Family History Family History  Problem Relation Age of Onset   Cancer Mother     Social History Social History   Tobacco Use   Smoking status: Never   Smokeless tobacco: Never  Vaping Use   Vaping status: Never Used   Substance Use Topics   Alcohol use: Never   Drug use: Never     Allergies   Sulfa antibiotics   Review of Systems Review of Systems  Constitutional:  Negative for chills and fever.  HENT:  Positive for congestion. Negative for ear pain and sore throat.   Respiratory:  Positive for cough and shortness of breath.   Cardiovascular:  Negative for chest pain and palpitations.     Physical Exam Triage Vital Signs ED Triage Vitals  Encounter Vitals Group     BP      Systolic BP Percentile      Diastolic BP Percentile      Pulse      Resp      Temp      Temp src      SpO2      Weight      Height      Head Circumference      Peak Flow      Pain Score      Pain Loc      Pain Education      Exclude from Growth Chart    No data found.  Updated Vital Signs BP 121/84   Pulse (!) 104   Temp 98.1 F (36.7 C)  Resp 20   SpO2 97%   Visual Acuity Right Eye Distance:   Left Eye Distance:   Bilateral Distance:    Right Eye Near:   Left Eye Near:    Bilateral Near:     Physical Exam Constitutional:      General: He is not in acute distress. HENT:     Right Ear: Tympanic membrane normal.     Left Ear: Tympanic membrane normal.     Nose: Congestion present.     Mouth/Throat:     Mouth: Mucous membranes are moist.     Pharynx: Oropharynx is clear.  Cardiovascular:     Rate and Rhythm: Normal rate and regular rhythm.     Heart sounds: Normal heart sounds.  Pulmonary:     Effort: Pulmonary effort is normal. No respiratory distress.     Breath sounds: Normal breath sounds.     Comments: Frequent cough Neurological:     Mental Status: He is alert.      UC Treatments / Results  Labs (all labs ordered are listed, but only abnormal results are displayed) Labs Reviewed  POC COVID19/FLU A&B COMBO    EKG   Radiology DG Chest 2 View Result Date: 07/26/2023 CLINICAL DATA:  Productive cough and chest congestion. EXAM: CHEST - 2 VIEW COMPARISON:  10/21/2018  FINDINGS: The heart size and mediastinal contours are within normal limits. Both lungs are clear. The visualized skeletal structures are unremarkable. IMPRESSION: Normal exam. Electronically Signed   By: Danae Orleans M.D.   On: 07/26/2023 13:20    Procedures Procedures (including critical care time)  Medications Ordered in UC Medications  albuterol (PROVENTIL) (2.5 MG/3ML) 0.083% nebulizer solution 2.5 mg (2.5 mg Nebulization Given 07/26/23 1319)    Initial Impression / Assessment and Plan / UC Course  I have reviewed the triage vital signs and the nursing notes.  Pertinent labs & imaging results that were available during my care of the patient were reviewed by me and considered in my medical decision making (see chart for details).    Productive cough, asthma exacerbation, acute upper respiratory infection.  O2 sat 97% on room air.  Albuterol nebulizer treatment given here and patient has decreased cough and shortness of breath.  Rapid flu and COVID-negative.  Chest x-ray negative.  Treating today with albuterol inhaler, prednisone, Zithromax.  Education provided on upper respiratory infection and asthma.  ED precautions discussed.  Appointment scheduled for patient to establish a PCP at [facility] on 08/10/2022.  Patient agrees to plan of care.  Final Clinical Impressions(s) / UC Diagnoses   Final diagnoses:  Productive cough  Asthma with acute exacerbation, unspecified asthma severity, unspecified whether persistent  Acute upper respiratory infection     Discharge Instructions      Use the albuterol inhaler as directed.  Take the Zithromax and prednisone as directed.    Follow-up with your new primary care provider as scheduled.  Go to the emergency department if you have shortness of breath or other concerning symptoms.     ED Prescriptions     Medication Sig Dispense Auth. Provider   albuterol (VENTOLIN HFA) 108 (90 Base) MCG/ACT inhaler Inhale 1-2 puffs into the lungs  every 6 (six) hours as needed. 18 g Mickie Bail, NP   predniSONE (DELTASONE) 10 MG tablet Take 4 tablets (40 mg total) by mouth daily for 5 days. 20 tablet Mickie Bail, NP   azithromycin (ZITHROMAX) 250 MG tablet Take 1 tablet (250 mg total) by  mouth daily. Take first 2 tablets together, then 1 every day until finished. 6 tablet Mickie Bail, NP      PDMP not reviewed this encounter.   Mickie Bail, NP 07/26/23 1331

## 2023-07-26 NOTE — ED Triage Notes (Signed)
Provider triage  

## 2023-10-13 ENCOUNTER — Ambulatory Visit: Payer: BC Managed Care – PPO | Admitting: Nurse Practitioner

## 2023-10-14 ENCOUNTER — Emergency Department
Admission: EM | Admit: 2023-10-14 | Discharge: 2023-10-14 | Disposition: A | Discharge status: Home / Self Care | Payer: MEDICAID | Attending: Emergency Medicine | Admitting: Emergency Medicine

## 2023-10-14 ENCOUNTER — Other Ambulatory Visit: Payer: Self-pay

## 2023-10-14 ENCOUNTER — Emergency Department: Payer: MEDICAID

## 2023-10-14 DIAGNOSIS — M7918 Myalgia, other site: Secondary | ICD-10-CM

## 2023-10-14 DIAGNOSIS — M791 Myalgia, unspecified site: Secondary | ICD-10-CM | POA: Diagnosis not present

## 2023-10-14 DIAGNOSIS — M545 Low back pain, unspecified: Secondary | ICD-10-CM | POA: Diagnosis not present

## 2023-10-14 DIAGNOSIS — R079 Chest pain, unspecified: Secondary | ICD-10-CM | POA: Insufficient documentation

## 2023-10-14 DIAGNOSIS — R0789 Other chest pain: Secondary | ICD-10-CM | POA: Diagnosis not present

## 2023-10-14 LAB — CBC
HCT: 46.8 % (ref 39.0–52.0)
Hemoglobin: 15.6 g/dL (ref 13.0–17.0)
MCH: 30.5 pg (ref 26.0–34.0)
MCHC: 33.3 g/dL (ref 30.0–36.0)
MCV: 91.4 fL (ref 80.0–100.0)
Platelets: 308 10*3/uL (ref 150–400)
RBC: 5.12 MIL/uL (ref 4.22–5.81)
RDW: 12.5 % (ref 11.5–15.5)
WBC: 6.9 10*3/uL (ref 4.0–10.5)
nRBC: 0 % (ref 0.0–0.2)

## 2023-10-14 LAB — BASIC METABOLIC PANEL
Anion gap: 8 (ref 5–15)
BUN: 10 mg/dL (ref 6–20)
CO2: 23 mmol/L (ref 22–32)
Calcium: 9 mg/dL (ref 8.9–10.3)
Chloride: 107 mmol/L (ref 98–111)
Creatinine, Ser: 1 mg/dL (ref 0.61–1.24)
GFR, Estimated: >60 mL/min (ref >60)
Glucose, Bld: 126 mg/dL — ABNORMAL HIGH (ref 70–99)
Potassium: 4 mmol/L (ref 3.5–5.1)
Sodium: 138 mmol/L (ref 135–145)

## 2023-10-14 LAB — URINALYSIS, ROUTINE W REFLEX MICROSCOPIC
Bilirubin Urine: NEGATIVE
Glucose, UA: NEGATIVE mg/dL
Hgb urine dipstick: NEGATIVE
Ketones, ur: NEGATIVE mg/dL
Leukocytes,Ua: NEGATIVE
Nitrite: NEGATIVE
Protein, ur: 30 mg/dL — AB
Specific Gravity, Urine: 1.031 — ABNORMAL HIGH (ref 1.005–1.030)
Squamous Epithelial / HPF: 0 /HPF (ref 0–5)
pH: 5 (ref 5.0–8.0)

## 2023-10-14 LAB — TROPONIN I (HIGH SENSITIVITY)
Troponin I (High Sensitivity): 4 ng/L (ref <18)
Troponin I (High Sensitivity): 4 ng/L (ref <18)

## 2023-10-14 MED ORDER — HYDROCODONE-ACETAMINOPHEN 5-325 MG PO TABS: 1.0000 | ORAL_TABLET | ORAL | 0 refills | Status: AC | PRN

## 2023-10-14 MED ORDER — IOHEXOL 350 MG/ML SOLN
100.0000 mL | Freq: Once | INTRAVENOUS | Status: AC | PRN
  Administered 2023-10-14: 100 mL via INTRAVENOUS

## 2023-10-14 MED ORDER — MORPHINE SULFATE (PF) 4 MG/ML IV SOLN
4.0000 mg | Freq: Once | INTRAVENOUS | Status: AC
  Administered 2023-10-14: 4 mg via INTRAVENOUS
  Filled 2023-10-14: qty 1

## 2023-10-14 MED ORDER — ONDANSETRON HCL 4 MG/2ML IJ SOLN
4.0000 mg | Freq: Once | INTRAMUSCULAR | Status: AC
Start: 2023-10-14 — End: 2023-10-14
  Administered 2023-10-14: 4 mg via INTRAVENOUS
  Filled 2023-10-14: qty 2

## 2023-10-14 NOTE — ED Notes (Signed)
 Patient transported to CT

## 2023-10-14 NOTE — ED Provider Notes (Signed)
 [facility] Provider Note    Event Date/Time   First MD Initiated Contact with Patient 10/14/23 1544     (approximate)  History   Chief Complaint: Back Pain and Chest Pain  HPI  David Valencia is a 38 y.o. male with a past medical history of ADHD, depression, presents emergency department for right flank pain.  According to the patient for the last 2 to 3 days he has been experiencing what he describes as significant pain in the right lower back.  This morning he states while driving to the emergency department he also developed a squeezing sensation in the chest.  Patient states that chest pain was significant during his triage however it has dissipated he now denies any pain.  No shortness of breath nausea or diaphoresis.  Patient denies any worsening of the pain with movement.  States it is somewhat better if he puts a heating pad on it but has not found any other relief.  No urinary symptoms.  No hematuria.  Physical Exam   Triage Vital Signs: ED Triage Vitals  Encounter Vitals Group     BP 10/14/23 1305 (!) 147/107     Systolic BP Percentile --      Diastolic BP Percentile --      Pulse Rate 10/14/23 1305 86     Resp 10/14/23 1305 20     Temp 10/14/23 1305 97.8 F (36.6 C)     Temp Source 10/14/23 1305 Oral     SpO2 10/14/23 1305 95 %     Weight --      Height --      Head Circumference --      Peak Flow --      Pain Score 10/14/23 1303 4     Pain Loc --      Pain Education --      Exclude from Growth Chart --     Most recent vital signs: Vitals:   10/14/23 1305  BP: (!) 147/107  Pulse: 86  Resp: 20  Temp: 97.8 F (36.6 C)  SpO2: 95%    General: Awake, no distress.  CV:  Good peripheral perfusion.  Regular rate and rhythm  Resp:  Normal effort.  Equal breath sounds bilaterally.  Abd:  No distention.  Soft, nontender.  No rebound or guarding.  Benign abdomen. Other:  Patient has mild tenderness over the right SI joint area.  No  midline tenderness.  No anterior abdominal tenderness.   ED Results / Procedures / Treatments   EKG  EKG viewed and interpreted by myself shows a normal sinus rhythm 83 bpm with a narrow QRS, normal axis, normal intervals, no concerning ST changes.  RADIOLOGY  I have reviewed interpret the chest x-ray images.  No consolidation on my evaluation. Radiology has read the x-ray as negative.   MEDICATIONS ORDERED IN ED: Medications  morphine (PF) 4 MG/ML injection 4 mg (has no administration in time range)  ondansetron (ZOFRAN) injection 4 mg (has no administration in time range)     IMPRESSION / MDM / ASSESSMENT AND PLAN / ED COURSE  I reviewed the triage vital signs and the nursing notes.  Patient's presentation is most consistent with acute presentation with potential threat to life or bodily function.  Patient presents emergency department for back pain and chest pain.  Patient describes the back pain as significant ongoing x 2 to 3 days now with significant chest pain although the chest pain has now diminished and  he states completely resolved.  Patient's lab work is reassuring with a normal chemistry, reassuring urinalysis with no blood, CBC is normal to normal white blood cell count.  Troponin negative.  We will repeat a troponin.  However given the patient's significant lower back pain with acute onset of chest pain we will proceed with a CTA to rule out any aortic syndrome.  This will also allow [place] a better view in the abdomen to ensure that there is no infectious etiology ureterolithiasis, etc.  Patient agreeable to plan of care.  Will treat with a small dose of pain medication and nausea medication while awaiting CTA results.  Patient CTA has resulted showing no acute finding.  Highly suspect this is going to be more musculoskeletal pain possibly SI pain.  We will place the patient on a short course of pain medication have the patient follow-up with his doctor.  Patient reassured by  the workup.  FINAL CLINICAL IMPRESSION(S) / ED DIAGNOSES   Back pain Chest pain   Note:  This document was prepared using Dragon voice recognition software and may include unintentional dictation errors.   Minna Antis, MD 10/14/23 1902

## 2023-10-14 NOTE — ED Triage Notes (Addendum)
 Pt to ED via POV from home. Pt reports sudden onset of right lower back pain x2-3 days. Pt denies radiation. Pt denies loss of bowel or bladder. Pt denies urinary symptoms.   Pt reports heating pad seems to ease pain.   During triage pt started to clench chest. Pt reports started having centralized CP while walking into triage. Pt appears uncomfortable and like he is about to pass out. Denies SOB. No cardiac hx. Pt appears more pale and diaphoretic.

## 2023-12-07 DIAGNOSIS — Z419 Encounter for procedure for purposes other than remedying health state, unspecified: Secondary | ICD-10-CM | POA: Diagnosis not present

## 2024-01-07 DIAGNOSIS — Z419 Encounter for procedure for purposes other than remedying health state, unspecified: Secondary | ICD-10-CM | POA: Diagnosis not present

## 2024-02-06 DIAGNOSIS — Z419 Encounter for procedure for purposes other than remedying health state, unspecified: Secondary | ICD-10-CM | POA: Diagnosis not present

## 2024-03-08 DIAGNOSIS — Z419 Encounter for procedure for purposes other than remedying health state, unspecified: Secondary | ICD-10-CM | POA: Diagnosis not present

## 2024-04-08 DIAGNOSIS — Z419 Encounter for procedure for purposes other than remedying health state, unspecified: Secondary | ICD-10-CM | POA: Diagnosis not present

## 2024-05-08 DIAGNOSIS — Z419 Encounter for procedure for purposes other than remedying health state, unspecified: Secondary | ICD-10-CM | POA: Diagnosis not present

## 2024-06-08 DIAGNOSIS — Z419 Encounter for procedure for purposes other than remedying health state, unspecified: Secondary | ICD-10-CM | POA: Diagnosis not present

## 2024-07-08 DIAGNOSIS — Z419 Encounter for procedure for purposes other than remedying health state, unspecified: Secondary | ICD-10-CM | POA: Diagnosis not present
